# Patient Record
Sex: Female | Born: 2000
Health system: Southern US, Community
[De-identification: ages and names within clinical notes are randomized; demographics above are authoritative.]

---
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
## 2021-01-29 ENCOUNTER — Emergency Department (HOSPITAL_COMMUNITY)
Admission: EM | Admit: 2021-01-29 | Discharge: 2021-01-29 | Disposition: A | Discharge status: Home / Self Care | Payer: BC Managed Care – PPO | Attending: Student | Admitting: Student

## 2021-01-29 ENCOUNTER — Encounter (HOSPITAL_COMMUNITY): Payer: Self-pay

## 2021-01-29 ENCOUNTER — Emergency Department (HOSPITAL_COMMUNITY): Payer: BC Managed Care – PPO

## 2021-01-29 DIAGNOSIS — Z5321 Procedure and treatment not carried out due to patient leaving prior to being seen by health care provider: Secondary | ICD-10-CM | POA: Insufficient documentation

## 2021-01-29 DIAGNOSIS — U071 COVID-19: Secondary | ICD-10-CM | POA: Insufficient documentation

## 2021-01-29 DIAGNOSIS — R0602 Shortness of breath: Secondary | ICD-10-CM | POA: Diagnosis present

## 2021-01-29 NOTE — ED Triage Notes (Signed)
Pt arrived via POV, states tested COVID positive this morning.

## 2021-01-29 NOTE — ED Provider Notes (Signed)
Emergency Medicine Provider Triage Evaluation Note  Crystal Finley , a 20 y.o. female  was evaluated in triage.  Pt complains of shortness of breath.  Took a COVID test today that was positive.  Patient reports that she started having chills, headache, body aches.  Patient reports the symptoms seem to resolve yesterday, but today she had an episode where she felt short of breath and and felt a brief pain in her chest when she took a breath.  She reports that pain is since resolved and she has not ever experienced it again but she was concerned because she had read about blood clots.  Patient is unvaccinated for COVID, no prior infections.  Review of Systems  Positive: Shortness of breath, chest pain Negative: Fevers, nausea, vomiting, cough, congestion  Physical Exam  BP 114/83   Pulse 89   Temp 98.8 F (37.1 C) (Oral)   SpO2 100%  Gen:   Awake, no distress   Resp:  Normal effort, CTA bilat MSK:   Moves extremities without difficulty  Other:    Medical Decision Making  Medically screening exam initiated at 3:34 PM.  Appropriate orders placed.  Crystal Finley was informed that the remainder of the evaluation will be completed by another provider, this initial triage assessment does not replace that evaluation, and the importance of remaining in the ED until their evaluation is complete.  Pain was brief and is now resolved with no recurrence and patient has normal vitals.  Low suspicion for PE.   Crystal Lodge, PA-C 01/29/21 1542    Crystal Kaplan, MD 01/30/21 [phone]

## 2022-11-30 IMAGING — CR DG CHEST 2V
2 series · 2 of 2 positions shown · non-contrast
Comparison: None.

CLINICAL DATA: Shortness of breath.

EXAM:
CHEST - 2 VIEW

[w chest pa]
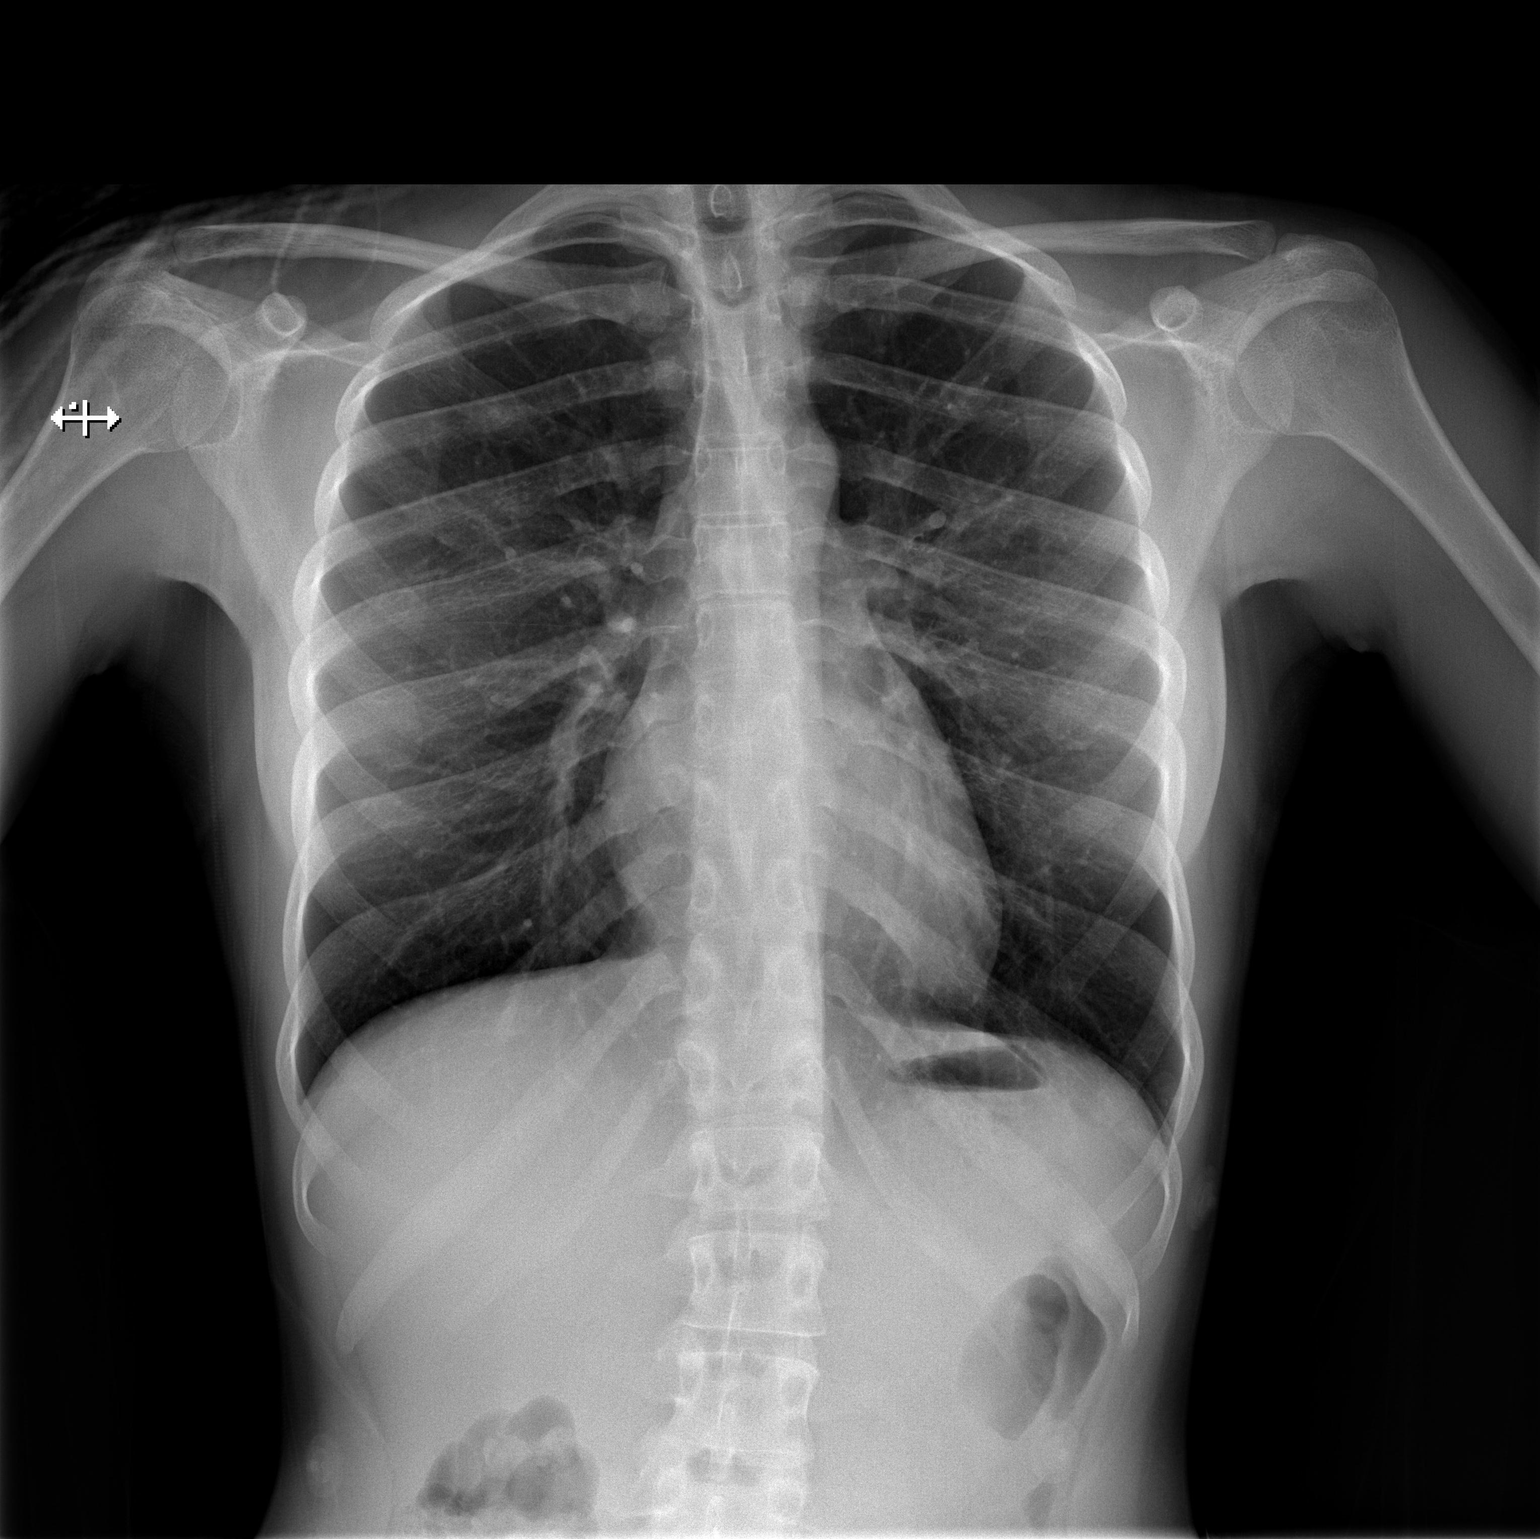

[w chest lat]
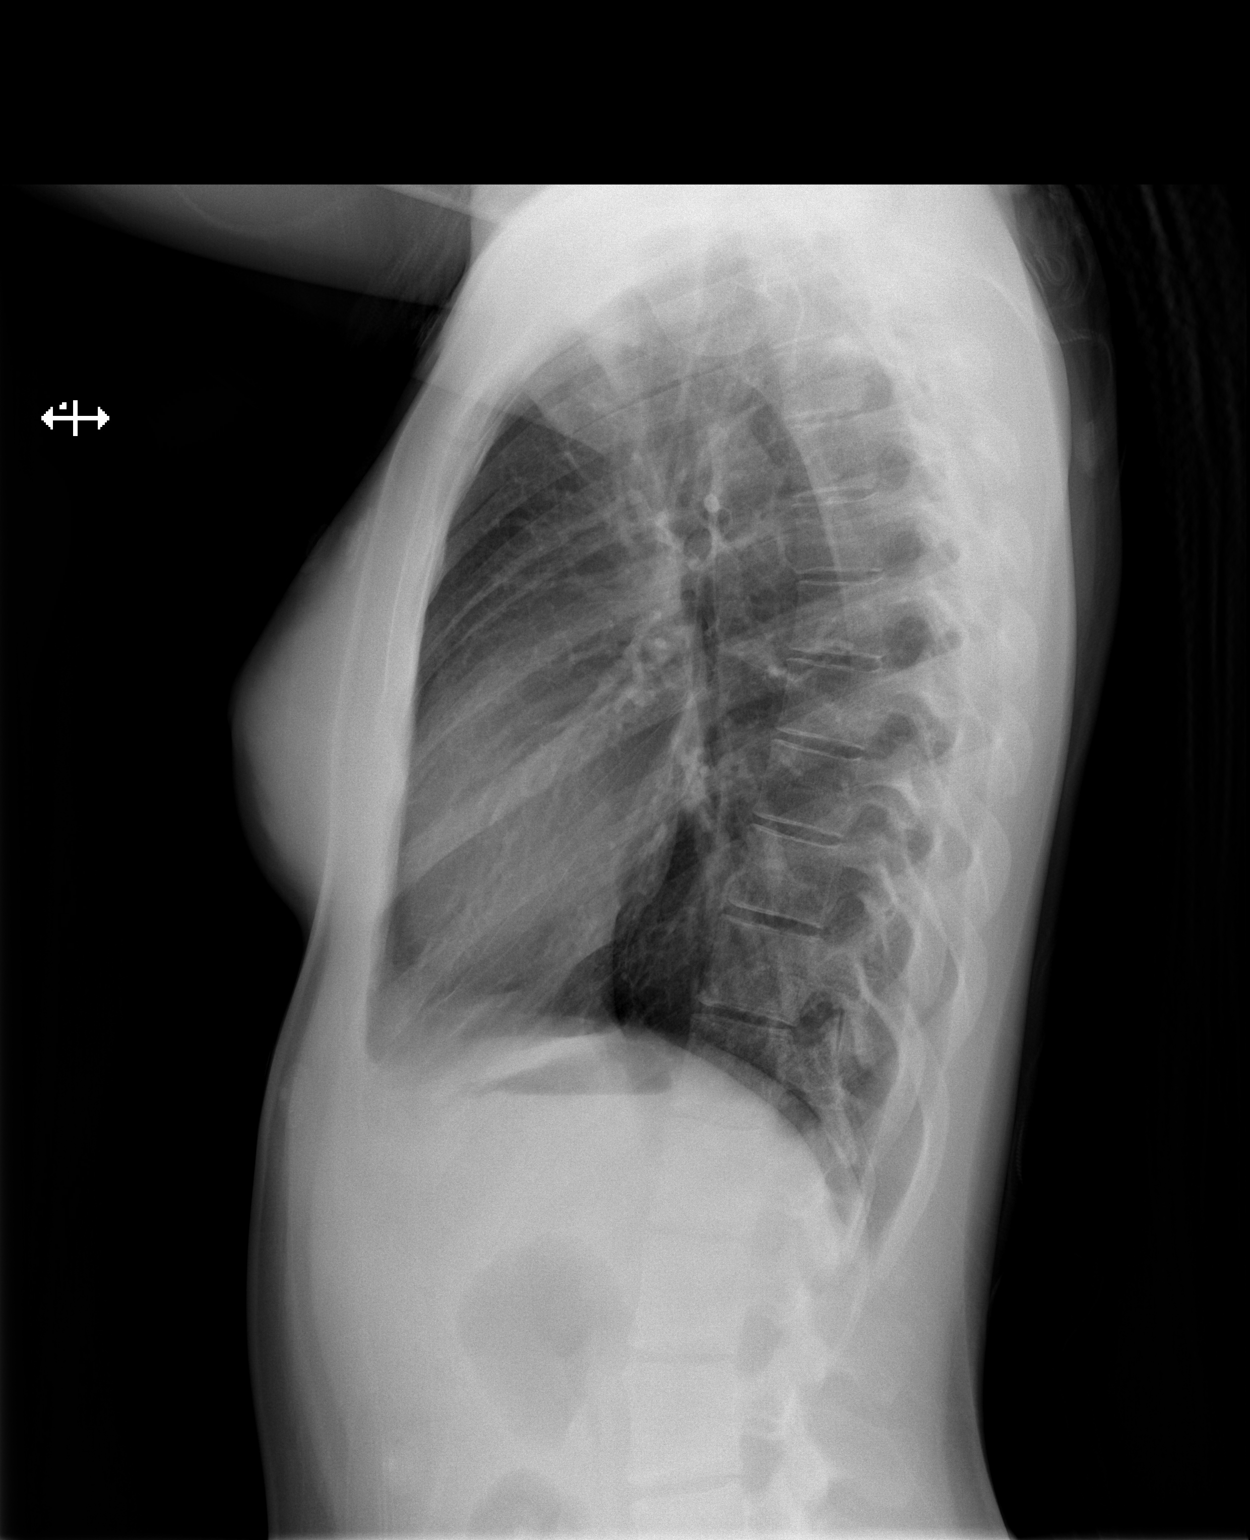

[2 of 2 positions shown; findings below may reference images not displayed]

FINDINGS: The cardiac silhouette, mediastinal and hilar contours are normal.
The lungs are clear. The bony thorax is intact.
IMPRESSION: No acute cardiopulmonary findings.

## 2023-10-20 ENCOUNTER — Encounter (HOSPITAL_COMMUNITY): Payer: Self-pay

## 2023-10-20 ENCOUNTER — Emergency Department (HOSPITAL_COMMUNITY)
Admission: EM | Admit: 2023-10-20 | Discharge: 2023-10-20 | Disposition: A | Discharge status: Home / Self Care | Payer: Self-pay | Attending: Emergency Medicine | Admitting: Emergency Medicine

## 2023-10-20 DIAGNOSIS — R319 Hematuria, unspecified: Secondary | ICD-10-CM

## 2023-10-20 DIAGNOSIS — E876 Hypokalemia: Secondary | ICD-10-CM | POA: Insufficient documentation

## 2023-10-20 LAB — CBC
HCT: 40.2 % (ref 36.0–46.0)
Hemoglobin: 11.8 g/dL — ABNORMAL LOW (ref 12.0–15.0)
MCH: 25.4 pg — ABNORMAL LOW (ref 26.0–34.0)
MCHC: 29.4 g/dL — ABNORMAL LOW (ref 30.0–36.0)
MCV: 86.6 fL (ref 80.0–100.0)
Platelets: 245 K/uL (ref 150–400)
RBC: 4.64 MIL/uL (ref 3.87–5.11)
RDW: 13.5 % (ref 11.5–15.5)
WBC: 9 K/uL (ref 4.0–10.5)
nRBC: 0 % (ref 0.0–0.2)

## 2023-10-20 LAB — BASIC METABOLIC PANEL WITH GFR
Anion gap: 9 (ref 5–15)
BUN: 6 mg/dL (ref 6–20)
CO2: 21 mmol/L — ABNORMAL LOW (ref 22–32)
Calcium: 9.7 mg/dL (ref 8.9–10.3)
Chloride: 105 mmol/L (ref 98–111)
Creatinine, Ser: 0.81 mg/dL (ref 0.44–1.00)
GFR, Estimated: >60 mL/min (ref >60)
Glucose, Bld: 129 mg/dL — ABNORMAL HIGH (ref 70–99)
Potassium: 3.2 mmol/L — ABNORMAL LOW (ref 3.5–5.1)
Sodium: 135 mmol/L (ref 135–145)

## 2023-10-20 LAB — HCG, SERUM, QUALITATIVE: Preg, Serum: NEGATIVE

## 2023-10-20 NOTE — Group Note (Deleted)
 Date:  10/20/2023 Time:  2:22 PM  Group Topic/Focus:  Wellness Toolbox:   The focus of this group is to discuss various aspects of wellness, balancing those aspects and exploring ways to increase the ability to experience wellness.  Patients will create a wellness toolbox for use upon discharge.     Participation Level:  {B[facility] PARTICIPATION OZCZO:77735}  Participation Quality:  {B[facility] PARTICIPATION QUALITY:22265}  Affect:  {B[facility] AFFECT:22266}  Cognitive:  {B[facility] COGNITIVE:22267}  Insight: {BHH Insight2:20797}  Engagement in Group:  {B[facility] ENGAGEMENT IN HMNLE:77731}  Modes of Intervention:  {BHH MODES OF INTERVENTION:22269}  Additional Comments:  ***  Crystal Finley 10/20/2023, 2:22 PM

## 2023-10-20 NOTE — Discharge Instructions (Signed)
 Follow up with Urgent care for results of your urine culture.  Return if fever, increasing back pain, vomiting or abdominal pain

## 2023-10-20 NOTE — ED Provider Notes (Signed)
 [place] [facility]PART[facility] AT [facility] Provider Note   CSN: [id number] Arrival date & time: 10/20/23  1237     Patient presents with: Vaginal Bleeding   Crystal Finley is a 23 y.o. female.   Patient complains of seeing a dime sized area of blood in her urine.  Patient reports being seen yesterday at urgent care and being diagnosed with urinary tract infection.  She is taking cephalexin for a UTI.  Patient is concerned that there is something wrong with her kidneys.  Patient states that she has noticed a small amount of blood when she wipes after urination.  Patient denies any difficulty urinating she has not had any fever or chills denies any nausea or vomiting she has not had any abdominal pain no flank pain.  Patient's notes from urgent care showed leukocytes and hemoglobin.  Patient has a urine culture pending.  The history is provided by the patient. No language interpreter was used.  Vaginal Bleeding      Prior to Admission medications   Not on File    Allergies: Patient has no known allergies.    Review of Systems  Genitourinary:  Positive for vaginal bleeding.  All other systems reviewed and are negative.   Updated Vital Signs BP 124/83 (BP Location: Right Arm)   Pulse (!) 112   Temp 98.1 F (36.7 C) (Oral)   Resp 18   LMP 09/27/2023 (Exact Date)   SpO2 100%   Physical Exam Vitals and nursing note reviewed.  Constitutional:      Appearance: She is well-developed.  HENT:     Head: Normocephalic.  Cardiovascular:     Rate and Rhythm: Normal rate.  Pulmonary:     Effort: Pulmonary effort is normal.  Abdominal:     General: There is no distension.  Musculoskeletal:        General: Normal range of motion.     Cervical back: Normal range of motion.  Skin:    General: Skin is warm.  Neurological:     General: No focal deficit present.     Mental Status: She is alert and oriented to person, place, and time.     (all labs ordered are  listed, but only abnormal results are displayed) Labs Reviewed  CBC - Abnormal; Notable for the following components:      Result Value   Hemoglobin 11.8 (*)    MCH 25.4 (*)    MCHC 29.4 (*)    All other components within normal limits  BASIC METABOLIC PANEL WITH GFR - Abnormal; Notable for the following components:   Potassium 3.2 (*)    CO2 21 (*)    Glucose, Bld 129 (*)    All other components within normal limits  HCG, SERUM, QUALITATIVE    EKG: None  Radiology: No results found.   Procedures   Medications Ordered in the ED - No data to display                                  Medical Decision Making Patient complains of seeing a spot of blood after urinating she is currently being treated for UTI  Amount and/or Complexity of Data Reviewed External Data Reviewed: notes.    Details: Urgent care notes reviewed patient has a urine culture pending she is on cephalexin. Labs: ordered. Decision-making details documented in ED Course.    Details: Labs ordered reviewed and  interpreted.  Patient's hemoglobin is 11.8.  Potassium is 3.2.  Patient has a normal BUN and creatinine  Risk Risk Details: Patient symptoms most likely related to current UTI.  Patient reassured that her kidney function is normal.  Patient counseled on urinary tract infections.  She is advised to return to the emergency department if fever chills nausea vomiting flank or abdominal pain.  Patient advised to continue cephalexin and to follow-up with urgent care for results of culture.  Patient discharged in stable condition        Final diagnoses:  Hematuria, unspecified type    ED Discharge Orders     None      An After Visit Summary was printed and given to the patient.     Flint Sonny POUR, PA-C 10/20/23 1705    Geraldene Hamilton, MD 10/21/23 [phone]

## 2023-10-20 NOTE — ED Triage Notes (Signed)
 Pt presents to ED from home C/O 1 episode of vaginal bleeding, described as quarter sized blood clot this AM. Pt reports she was seen at [facility] yesterday and is being treated currently for UTI. Pt also reports she started a new birth control 2 weeks ago.
